# Patient Record
Sex: Male | Born: 1995 | Race: White | Hispanic: No | Marital: Married | Smoking: Current some day smoker
Health system: Southern US, Community
[De-identification: ages and names within clinical notes are randomized; demographics above are authoritative.]

---
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
## 2013-11-06 IMAGING — CT CT ABD-PELV W/ CM
1 of 2 series · 15 of 32 positions shown, 19 images · IV contrast (isovue)
Comparison: none

REASON FOR EXAM: Abd pain   inguinal  lymphatenopathy
COMMENTS:

PROCEDURE:     KCT - KCT ABDOMEN/PELVIS W  - March 01, 2011  [DATE]
RESULT:     Comparison:  None
TECHNIQUE: Multiple axial images of the abdomen and pelvis were performed
from the lung bases to the pubic symphysis, with p.o. contrast and with 85
mL of Isovue 300 intravenous contrast.

[Series 2: abd with 5.0 i40f 3 · axial · 0.68mm/px · z∈[-308,+108]mm · 15 of 91 slices shown, 19 images]
[im 4/91  soft-tissue]
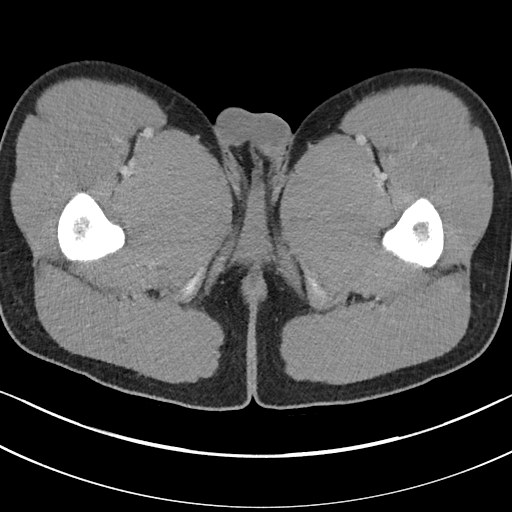
[im 4/91  bone]
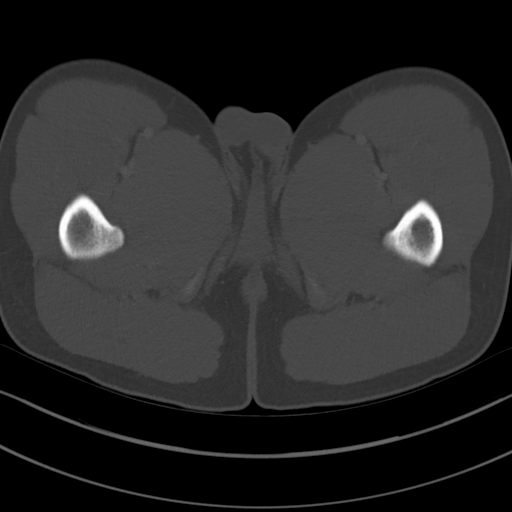
[im 11/91  soft-tissue]
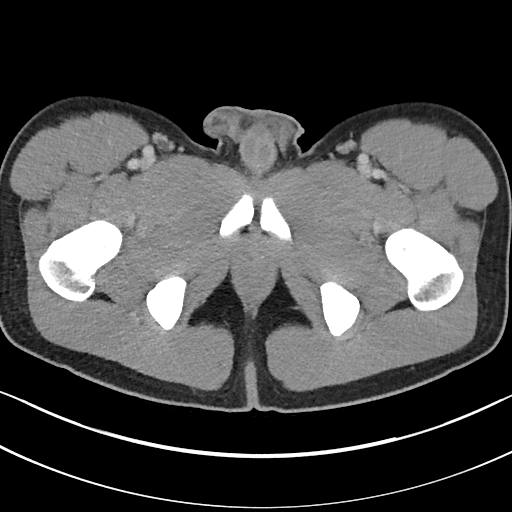
[im 19/91  soft-tissue]
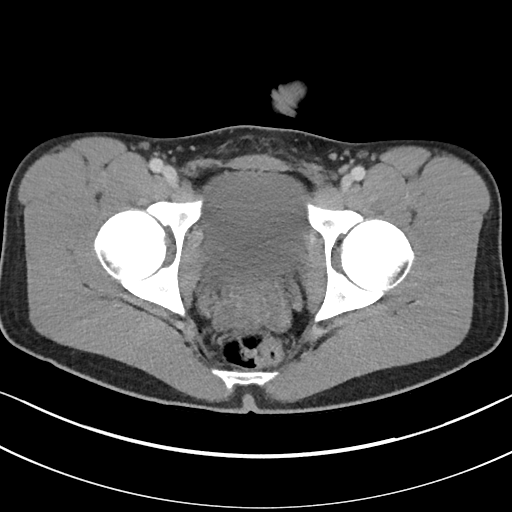
[im 26/91  soft-tissue]
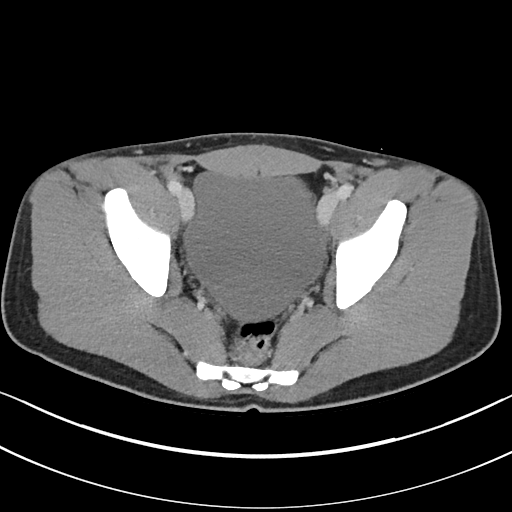
[im 33/91  soft-tissue]
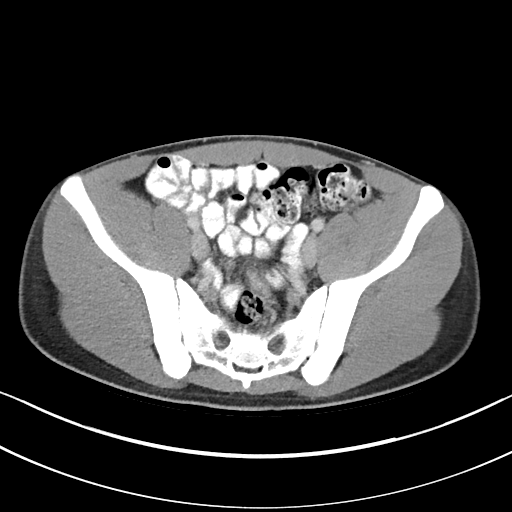
[im 40/91  soft-tissue]
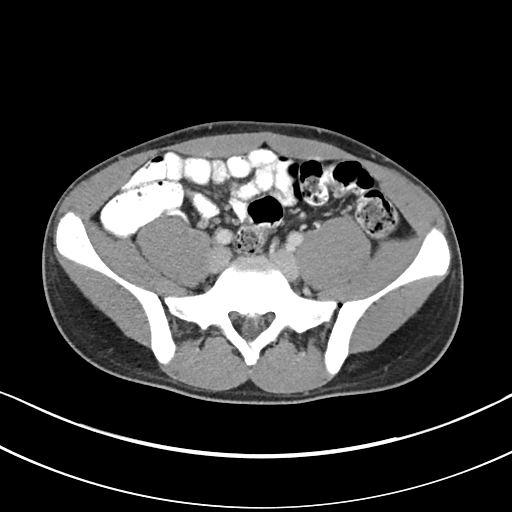
[im 47/91  soft-tissue]
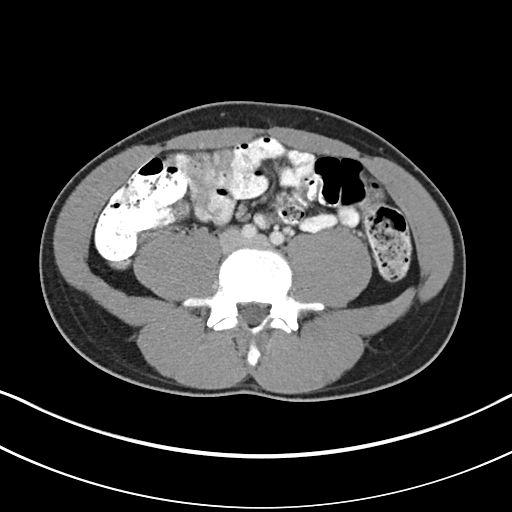
[im 51/91  soft-tissue]
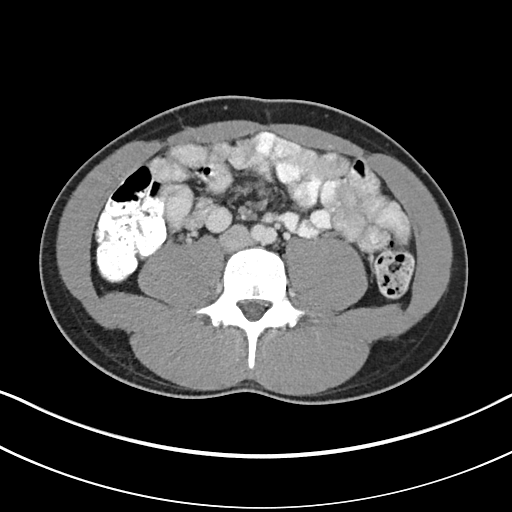
[im 58/91  soft-tissue]
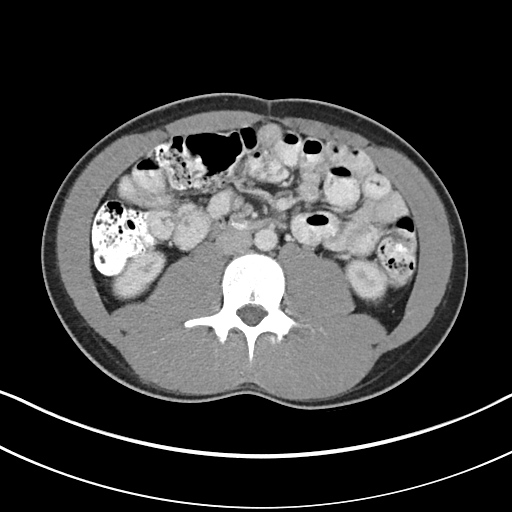
[im 58/91  bone]
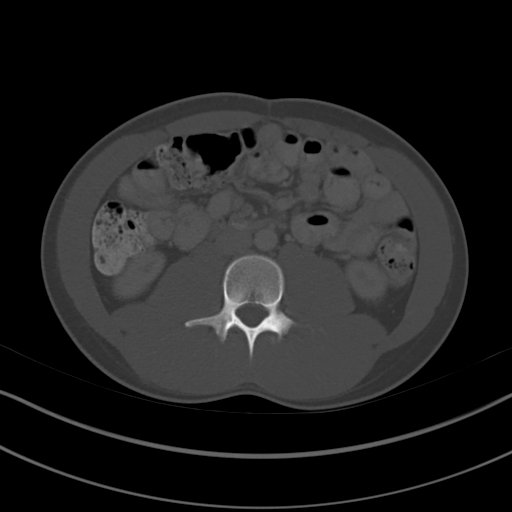
[im 65/91  soft-tissue]
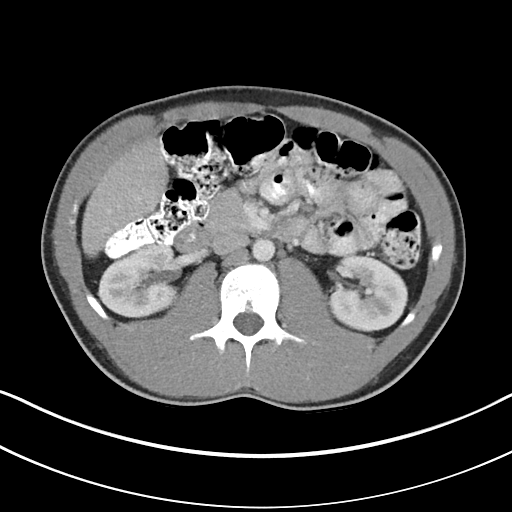
[im 73/91  soft-tissue]
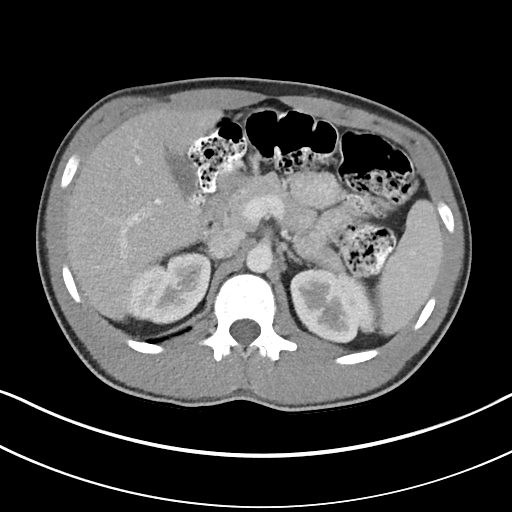
[im 76/91  lung]
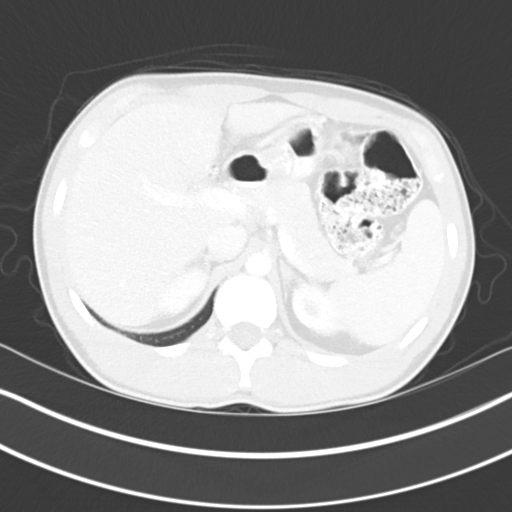
[im 80/91  soft-tissue]
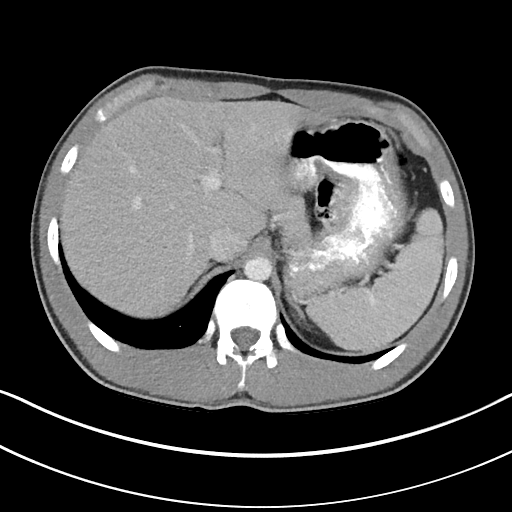
[im 80/91  lung]
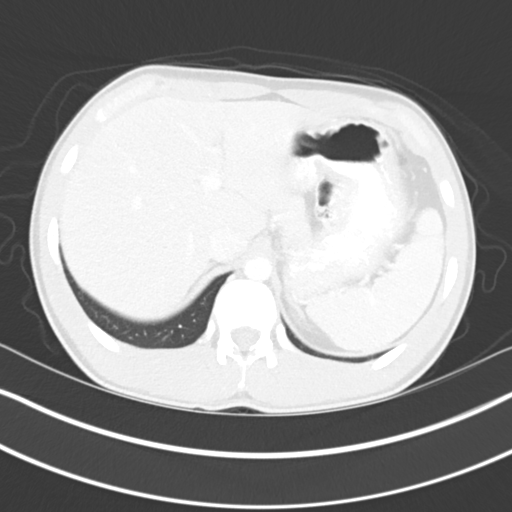
[im 83/91  lung]
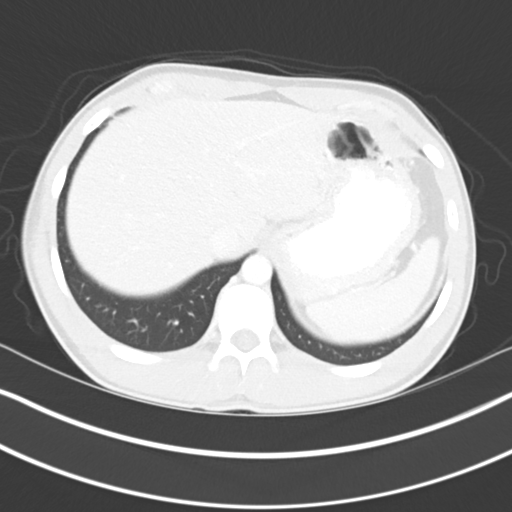
[im 87/91  soft-tissue]
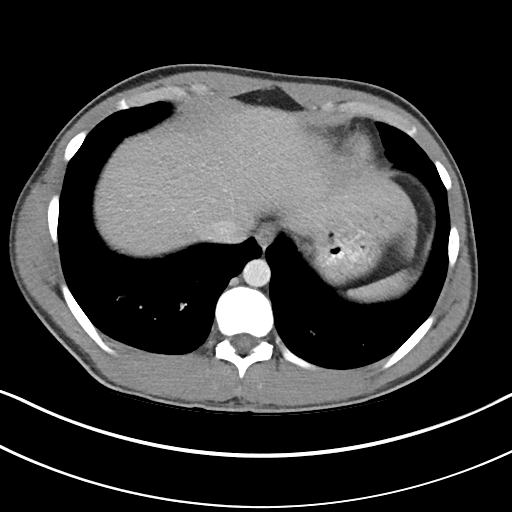
[im 87/91  lung]
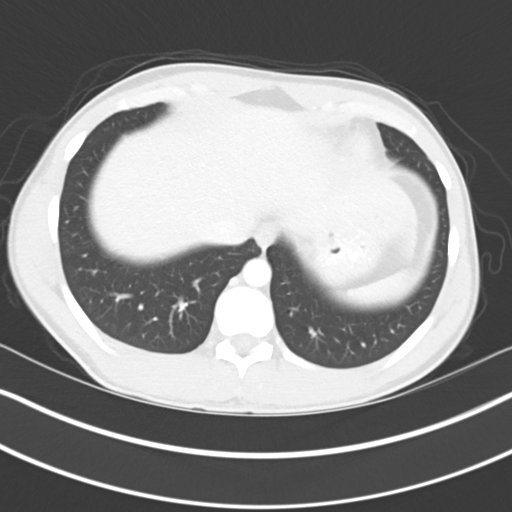

[15 of 32 positions shown; findings below may reference images not displayed]

FINDINGS: Minimal low-attenuation along falciform ligament likely represents focal
fatty deposition. The gallbladder, spleen, adrenals, pancreas, and
gallbladder are unremarkable. The kidneys enhance normally. No
retroperitoneal or mesenteric lymphadenopathy. The small and large bowel are
normal in caliber. The appendix is normal. There are small lymph nodes at
the site of the palpable markers just above the groin, bilaterally. These
are not pathologically enlarged.

No aggressive lytic or sclerotic osseous lesions are identified.
IMPRESSION: No lymphadenopathy in the abdomen or pelvis.

## 2015-03-08 ENCOUNTER — Emergency Department
Admission: EM | Admit: 2015-03-08 | Discharge: 2015-03-08 | Disposition: A | Discharge status: Home / Self Care | Payer: No Typology Code available for payment source | Attending: Emergency Medicine | Admitting: Emergency Medicine

## 2015-03-08 ENCOUNTER — Encounter: Payer: Self-pay | Admitting: Medical Oncology

## 2015-03-08 ENCOUNTER — Emergency Department: Payer: No Typology Code available for payment source

## 2015-03-08 DIAGNOSIS — S6292XA Unspecified fracture of left wrist and hand, initial encounter for closed fracture: Secondary | ICD-10-CM | POA: Diagnosis not present

## 2015-03-08 DIAGNOSIS — S62152A Displaced fracture of hook process of hamate [unciform] bone, left wrist, initial encounter for closed fracture: Secondary | ICD-10-CM | POA: Diagnosis not present

## 2015-03-08 DIAGNOSIS — S62142A Displaced fracture of body of hamate [unciform] bone, left wrist, initial encounter for closed fracture: Secondary | ICD-10-CM

## 2015-03-08 DIAGNOSIS — Y9289 Other specified places as the place of occurrence of the external cause: Secondary | ICD-10-CM | POA: Diagnosis not present

## 2015-03-08 DIAGNOSIS — Y998 Other external cause status: Secondary | ICD-10-CM | POA: Diagnosis not present

## 2015-03-08 DIAGNOSIS — Y9389 Activity, other specified: Secondary | ICD-10-CM | POA: Insufficient documentation

## 2015-03-08 DIAGNOSIS — S6992XA Unspecified injury of left wrist, hand and finger(s), initial encounter: Secondary | ICD-10-CM | POA: Diagnosis present

## 2015-03-08 MED ORDER — HYDROCODONE-ACETAMINOPHEN 5-325 MG PO TABS: 1.0000 | ORAL_TABLET | ORAL | Status: DC | PRN

## 2015-03-08 MED ORDER — HYDROCODONE-ACETAMINOPHEN 5-325 MG PO TABS
1.0000 | ORAL_TABLET | Freq: Once | ORAL | Status: AC
  Administered 2015-03-08: 1 via ORAL
  Filled 2015-03-08: qty 1

## 2015-03-08 NOTE — Discharge Instructions (Signed)
Call Dr. Samuel GermanyKrasinski's office today for an appointment. The splint on until seen by the orthopedist. Ice and elevate for swelling reduction. Norco if needed for pain.

## 2015-03-08 NOTE — ED Provider Notes (Signed)
[facility] Emergency Department Provider Note  ____________________________________________  Time seen: Approximately 7:54 AM  I have reviewed the triage vital signs and the nursing notes.   HISTORY  Chief Complaint Wrist Pain  HPI Scott Kelley is a 19 y.o. male is here complaining of left wrist pain since last night when he punched a friend while roughhousing. Patient states that during the night he woke up with severe pain to his left wrist. He denies any previous injury. He denies any head injury or loss of consciousness in this event. He is not taking any over-the-counter medication for his pain.Currently he rates his pain as 8 out of 10.   History reviewed. No pertinent past medical history.  There are no active problems to display for this patient.   History reviewed. No pertinent past surgical history.  Current Outpatient Rx  Name  Route  Sig  Dispense  Refill  . HYDROcodone-acetaminophen (NORCO/VICODIN) 5-325 MG tablet   Oral   Take 1 tablet by mouth every 4 (four) hours as needed for moderate pain.   20 tablet   0     Allergies Review of patient's allergies indicates no known allergies.  No family history on file.  Social History Social History  Substance Use Topics  . Smoking status: None  . Smokeless tobacco: None  . Alcohol Use: None    Review of Systems Constitutional: No fever/chills Eyes: No visual changes. ENT: No trauma Cardiovascular: Denies chest pain. Respiratory: Denies shortness of breath. Gastrointestinal: No abdominal pain.  No nausea, no vomiting.   Musculoskeletal: Negative for back pain. Positive left wrist pain. Skin: Negative for rash. Neurological: Negative for headaches, focal weakness or numbness.  10-point ROS otherwise negative.  ____________________________________________   PHYSICAL EXAM:  VITAL SIGNS: ED Triage Vitals  Enc Vitals Group     BP 03/08/15 0732 136/75 mmHg     Pulse Rate  03/08/15 0732 67     Resp 03/08/15 0732 16     Temp 03/08/15 0732 97.9 F (36.6 C)     Temp Source 03/08/15 0732 Oral     SpO2 03/08/15 0732 99 %     Weight 03/08/15 0728 150 lb (68.04 kg)     Height 03/08/15 0728 5\' 8"  (1.727 m)     Head Cir --      Peak Flow --      Pain Score 03/08/15 0729 8     Pain Loc --      Pain Edu? --      Excl. in GC? --     Constitutional: Alert and oriented. Well appearing and in no acute distress. Eyes: Conjunctivae are normal. PERRL. EOMI. Head: Atraumatic. Nose: No congestion/rhinnorhea. Neck: No stridor.   Cardiovascular: Normal rate, regular rhythm. Grossly normal heart sounds.  Good peripheral circulation. Respiratory: Normal respiratory effort.  No retractions. Lungs CTAB. Gastrointestinal: Soft and nontender. No distention.  Musculoskeletal: Moderate tenderness on palpation of the carpal bones left wrist. There is no gross deformity noted but there is some soft tissue edema present. Range of motion is restricted secondary to patient's pain. Range of motion distal to the injury is within normal limits and motor sensory function intact. Neurologic:  Normal speech and language. No gross focal neurologic deficits are appreciated. No gait instability. Skin:  Skin is warm, dry and intact. No rash noted. No abrasions or ecchymosis noted. Psychiatric: Mood and affect are normal. Speech and behavior are normal.  ____________________________________________   LABS (all labs ordered  are listed, but only abnormal results are displayed)  Labs Reviewed - No data to display  RADIOLOGY  Left hand x-ray per radiologist shows small acute fracture of the lumbar aspect of the hamate. I, Tommi Rumps, personally viewed and evaluated these images (plain radiographs) as part of my medical decision making.  ____________________________________________   PROCEDURES  Procedure(s) performed: None  Critical Care performed:  No  ____________________________________________   INITIAL IMPRESSION / ASSESSMENT AND PLAN / ED COURSE  Pertinent labs & imaging results that were available during my care of the patient were reviewed by me and considered in my medical decision making (see chart for details)  Patient is placed in a short arm splint and given instructions to follow-up with Dr. Martha Clan. Patient was encouraged to ice and elevate as needed for swelling. He is given a prescription for Norco as needed for pain. He is return to the emergency room if any severe worsening of his symptoms.   ____________________________________________   FINAL CLINICAL IMPRESSION(S) / ED DIAGNOSES  Final diagnoses:  Fracture, hamate, left, closed, initial encounter      Tommi Rumps, PA-C 03/08/15 1551  Myrna Blazer, MD 03/08/15 [phone]

## 2015-03-08 NOTE — ED Notes (Signed)
Pt c/o left wrist pain since punching something last night.

## 2015-07-27 ENCOUNTER — Ambulatory Visit (INDEPENDENT_AMBULATORY_CARE_PROVIDER_SITE_OTHER): Payer: BLUE CROSS/BLUE SHIELD | Admitting: Family Medicine

## 2015-07-27 ENCOUNTER — Encounter: Payer: Self-pay | Admitting: Family Medicine

## 2015-07-27 VITALS — BP 118/70 | HR 66 | Temp 97.9°F | Resp 16 | Ht 68.0 in | Wt 160.0 lb

## 2015-07-27 DIAGNOSIS — B349 Viral infection, unspecified: Secondary | ICD-10-CM | POA: Diagnosis not present

## 2015-07-27 DIAGNOSIS — J029 Acute pharyngitis, unspecified: Secondary | ICD-10-CM | POA: Diagnosis not present

## 2015-07-27 LAB — POC INFLUENZA A&B (BINAX/QUICKVUE)
Influenza A, POC: NEGATIVE
Influenza B, POC: NEGATIVE

## 2015-07-27 LAB — POCT RAPID STREP A (OFFICE): Rapid Strep A Screen: NEGATIVE

## 2015-07-27 MED ORDER — DOXYCYCLINE HYCLATE 100 MG PO TABS: 100.0000 mg | ORAL_TABLET | Freq: Two times a day (BID) | ORAL | Status: DC

## 2015-07-27 NOTE — Patient Instructions (Signed)
Discussed use of Delsym for cough, Claritin D for congestion, and Benadryl at night for post nasal drainage. Start doxycycline if fever, body aches and headache are not improving over the next day or two.

## 2015-07-27 NOTE — Progress Notes (Signed)
Subjective:     Patient ID: Scott ShropshireEvan M Kelley, male   DOB: November 15, 1995, 20 y.o.   MRN: [id number]  HPI  Chief Complaint  Patient presents with  . Generalized Body Aches    Patient comes in office with concerns of cold like symptoms. Patient reports that when he woke up today and went to work he began experiencing body aches, fatigue, sore throat, sinus pain and pressure, cough and post nasal drainage. Patient states for the past several days he has been having more frequent headaches, patient did take Tylenol this morning because he felt feverish.   Reports possible strep exposure through his girl friend's mom and flu exposure at work. He does have possibility of tick exposure playing disc golf on the weekends but has not removed any ticks. Current job is in Geologist, engineeringgraphic arts.   Review of Systems     Objective:   Physical Exam  Constitutional: He appears well-developed and well-nourished. No distress.  Ears: T.M's intact without inflammation Throat: no tonsillar enlargement or exudate Neck: no cervical adenopathy Lungs: clear     Assessment:    1. Pharyngitis - POCT rapid strep A  2. Viral syndrome - POC Influenza A&B(BINAX/QUICKVUE) - doxycycline (VIBRA-TABS) 100 MG tablet; Take 1 tablet (100 mg total) by mouth 2 (two) times daily.  Dispense: 14 tablet; Refill: 1    Plan:    Discussed otc medication. May start doxycycline if headaches, body aches and fever not abating in the next day or two.

## 2015-11-06 ENCOUNTER — Ambulatory Visit (INDEPENDENT_AMBULATORY_CARE_PROVIDER_SITE_OTHER): Payer: BLUE CROSS/BLUE SHIELD | Admitting: Family Medicine

## 2015-11-06 ENCOUNTER — Encounter: Payer: Self-pay | Admitting: Family Medicine

## 2015-11-06 VITALS — BP 104/70 | HR 74 | Temp 97.6°F | Resp 16 | Wt 161.8 lb

## 2015-11-06 DIAGNOSIS — G43009 Migraine without aura, not intractable, without status migrainosus: Secondary | ICD-10-CM | POA: Diagnosis not present

## 2015-11-06 DIAGNOSIS — T23262A Burn of second degree of back of left hand, initial encounter: Secondary | ICD-10-CM

## 2015-11-06 MED ORDER — NORTRIPTYLINE HCL 10 MG PO CAPS: ORAL_CAPSULE | ORAL | 1 refills | Status: DC

## 2015-11-06 NOTE — Patient Instructions (Signed)
Cleanse burn with soap and water and apply antibiotic ointment and dressing. Get your eye exam updated to see if that is a trigger or headaches.

## 2015-11-06 NOTE — Progress Notes (Addendum)
Subjective:     Patient ID: Scott ShropshireEvan M Banwart, male   DOB: April 27, 1995, 20 y.o.   MRN: [id number]  HPI  Chief Complaint  Patient presents with  . Fatigue    Patient comes in office today with concerns of fatigue since 11/02/15. Patient [place], patient also complains of nausea associated.   . Burn    Patient would like to havce his left hand examined for a burned he got while working on 11/02/15, patient reports applying neosporin and perioxide to skin.   States he is having up to 4 pressure headaches a week associated with photosensitivity and nausea. Reports one may have occurred after he did not get enough sleep. He now tries to get 8 hours. He treats the headaches with BC's or ibuprofen along with going into a dark room. Has had to leave work on one occasion due to headache. Current caffeine consumption is two drinks a day. Last eye exam 2 years ago (glasses). Maternal hx of migraines. 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   Review of Systems     Objective:   Physical Exam  Constitutional: He appears well-developed and well-nourished. No distress.  Eyes: EOM are normal. Pupils are equal, round, and reactive to light.  Musculoskeletal:  Grip strength 5/5  Neurological: Coordination normal.  Skin:  Dorsum of his left hand with healing first and second degree burns. Cleansed with saline with topical abx and dressing applied.       Assessment:    1. Migraine without aura and without status migrainosus, not intractable - nortriptyline (PAMELOR) 10 MG capsule; One to three at bedtime  Dispense: 30 capsule; Refill: 1  2. Second degree burn of back of hand, left, initial encounter    Plan:    Get eye exam and avoid missing sleep time. Cleanse burn with soap and water and apply abx ointment with dressing.

## 2015-12-05 ENCOUNTER — Ambulatory Visit: Payer: BLUE CROSS/BLUE SHIELD | Admitting: Family Medicine

## 2016-01-15 ENCOUNTER — Ambulatory Visit: Payer: BLUE CROSS/BLUE SHIELD | Admitting: Family Medicine

## 2016-01-16 ENCOUNTER — Encounter: Payer: Self-pay | Admitting: Family Medicine

## 2016-01-16 ENCOUNTER — Ambulatory Visit (INDEPENDENT_AMBULATORY_CARE_PROVIDER_SITE_OTHER): Payer: BLUE CROSS/BLUE SHIELD | Admitting: Family Medicine

## 2016-01-16 ENCOUNTER — Ambulatory Visit
Admission: RE | Admit: 2016-01-16 | Discharge: 2016-01-16 | Disposition: A | Discharge status: Home / Self Care | Payer: BLUE CROSS/BLUE SHIELD | Source: Ambulatory Visit | Attending: Family Medicine | Admitting: Family Medicine

## 2016-01-16 VITALS — BP 122/68 | HR 56 | Temp 98.1°F | Resp 16 | Wt 161.0 lb

## 2016-01-16 DIAGNOSIS — R1084 Generalized abdominal pain: Secondary | ICD-10-CM

## 2016-01-16 LAB — POCT URINALYSIS DIPSTICK
Bilirubin, UA: NEGATIVE
Blood, UA: NEGATIVE
Glucose, UA: NEGATIVE
Ketones, UA: NEGATIVE
Leukocytes, UA: NEGATIVE
Nitrite, UA: NEGATIVE
Protein, UA: NEGATIVE
Spec Grav, UA: >=1.03
Urobilinogen, UA: 0.2
pH, UA: 6

## 2016-01-16 MED ORDER — HYOSCYAMINE SULFATE 0.125 MG PO TABS: 0.1250 mg | ORAL_TABLET | ORAL | 0 refills | Status: DC | PRN

## 2016-01-16 NOTE — Progress Notes (Signed)
Patient: Scott Kelley Male    DOB: 22-May-1995   20 y.o.   MRN: [id number] Visit Date: 01/16/2016  Today's Provider: Mila Merry, MD   Chief Complaint  Patient presents with  . Abdominal Pain   Subjective:    HPI Patient comes in today c/o generalized abdominal pain. He reports that it has been going on for several weeks. Patient reports that he has not been eating as much due to the pain. He describes pain as a cramping sensation with associated nausea. He reports that he does feel like he has to have a BM, but sometimes nothing comes out. He also denies any fever or vomiting. It affects entire abdomen, but is worse between umbilicus and epigastrium.     No Known Allergies   Current Outpatient Prescriptions:  .  fluticasone (FLONASE) 50 MCG/ACT nasal spray, Place into both nostrils daily., Disp: , Rfl:  .  loratadine (CLARITIN) 10 MG tablet, Take 10 mg by mouth daily., Disp: , Rfl:  .  nortriptyline (PAMELOR) 10 MG capsule, One to three at bedtime, Disp: 30 capsule, Rfl: 1  Review of Systems  Constitutional: Positive for appetite change. Negative for activity change, chills, diaphoresis, fatigue, fever and unexpected weight change.  Cardiovascular: Negative.   Gastrointestinal: Positive for abdominal pain, blood in stool, constipation and nausea.  Genitourinary: Positive for dysuria. Negative for decreased urine volume, difficulty urinating, flank pain, frequency and urgency.    Social History  Substance Use Topics  . Smoking status: Current Some Day Smoker  . Smokeless tobacco: Never Used  . Alcohol use Not on file   Objective:   BP 122/68 (BP Location: Left Arm, Patient Position: Sitting, Cuff Size: Normal)   Pulse (!) 56   Temp 98.1 F (36.7 C)   Resp 16   Wt 161 lb (73 kg)   BMI 24.48 kg/m   Physical Exam  General Appearance:    Alert, cooperative, no distress  Eyes:    PERRL, conjunctiva/corneas clear, EOM's intact       Lungs:     Clear to  auscultation bilaterally, respirations unlabored  Heart:    Regular rate and rhythm  Abdomen:   bowel sounds present and normal in all 4 quadrants, soft and round. Mild diffuse tenderness, especially suprapubic area. No CVA tenderness    Results for orders placed or performed in visit on 01/16/16  POCT urinalysis dipstick  Result Value Ref Range   Color, UA yellow    Clarity, UA clear    Glucose, UA negative    Bilirubin, UA negative    Ketones, UA negative    Spec Grav, UA >=1.030    Blood, UA negative    pH, UA 6.0    Protein, UA negative    Urobilinogen, UA 0.2    Nitrite, UA negative    Leukocytes, UA Negative Negative       Assessment & Plan:     1. Generalized abdominal pain  - CBC - Comprehensive metabolic panel - H Pylori, IGM, IGG, IGA AB - Amylase - DG Abd 2 Views; Future - hyoscyamine (LEVSIN, ANASPAZ) 0.125 MG tablet; Take 1 tablet (0.125 mg total) by mouth every 4 (four) hours as needed for cramping.  Dispense: 30 tablet; Refill: 0 - POCT urinalysis dipstick     The entirety of the information documented in the History of Present Illness, Review of Systems and Physical Exam were personally obtained by me. Portions of this information were initially  documented by Anson Oregonachelle Presley, CMA and reviewed by me for thoroughness and accuracy.   Mila Merryonald Vinita Prentiss, MD  [facility]

## 2016-01-16 NOTE — Patient Instructions (Addendum)
Go to the [facility] on [address] for abdominal Xray  

## 2016-01-17 ENCOUNTER — Telehealth: Payer: Self-pay

## 2016-01-17 NOTE — Telephone Encounter (Signed)
Haven't seen a prior auth for this. Please call pharmacy and see what problem is. Maybe it be covered if they change hyoscyamine to sublingual tablets.

## 2016-01-17 NOTE — Telephone Encounter (Signed)
-----   Message from Malva Limesonald E Fisher, MD sent at 01/17/2016  1:38 PM EDT ----- Herby AbrahamXray shows some signs of constipation, so far labs are negative. Test for h. Pylori are still pending. Should start taking a tablespoon of metamucil daily and can take prescription for hyoscyamine as needed (was sent to pharmacy yesterday). H. Pylori results should be in before the weekend.

## 2016-01-17 NOTE — Telephone Encounter (Signed)
Left message to call back  

## 2016-01-17 NOTE — Telephone Encounter (Signed)
Advised patient as below. Patient reports that he wasn't able to get the prescription yesterday because it requires a prior auth. Patient wanted to know if you could change his medication to something different? Please advise. Thanks!

## 2016-01-18 LAB — COMPREHENSIVE METABOLIC PANEL
ALT: 16 IU/L (ref 0–44)
AST: 15 IU/L (ref 0–40)
Albumin/Globulin Ratio: 1.9 (ref 1.2–2.2)
Albumin: 4.8 g/dL (ref 3.5–5.5)
Alkaline Phosphatase: 64 IU/L (ref 39–117)
BUN/Creatinine Ratio: 12 (ref 9–20)
BUN: 14 mg/dL (ref 6–20)
Bilirubin Total: 1.3 mg/dL — ABNORMAL HIGH (ref 0.0–1.2)
CO2: 27 mmol/L (ref 18–29)
Calcium: 10 mg/dL (ref 8.7–10.2)
Chloride: 101 mmol/L (ref 96–106)
Creatinine, Ser: 1.16 mg/dL (ref 0.76–1.27)
GFR calc Af Amer: 103 mL/min/{1.73_m2} (ref >59)
GFR calc non Af Amer: 89 mL/min/{1.73_m2} (ref >59)
Globulin, Total: 2.5 g/dL (ref 1.5–4.5)
Glucose: 88 mg/dL (ref 65–99)
Potassium: 4.1 mmol/L (ref 3.5–5.2)
Sodium: 142 mmol/L (ref 134–144)
Total Protein: 7.3 g/dL (ref 6.0–8.5)

## 2016-01-18 LAB — H PYLORI, IGM, IGG, IGA AB
H Pylori IgG: <0.9 U/mL (ref 0.0–0.8)
H pylori, IgM Abs: <9 units (ref 0.0–8.9)
H. pylori, IgA Abs: <9 units (ref 0.0–8.9)

## 2016-01-18 LAB — CBC
Hematocrit: 45.6 % (ref 37.5–51.0)
Hemoglobin: 15.9 g/dL (ref 12.6–17.7)
MCH: 31.9 pg (ref 26.6–33.0)
MCHC: 34.9 g/dL (ref 31.5–35.7)
MCV: 91 fL (ref 79–97)
Platelets: 219 10*3/uL (ref 150–379)
RBC: 4.99 x10E6/uL (ref 4.14–5.80)
RDW: 13 % (ref 12.3–15.4)
WBC: 7.1 10*3/uL (ref 3.4–10.8)

## 2016-01-18 LAB — AMYLASE: Amylase: 111 U/L (ref 31–124)

## 2016-01-18 NOTE — Telephone Encounter (Signed)
Called [facility] concerning hyoscyamine rx. Pharmacy stated that there is no PA needed. Patient's out-of-pocket cost was $15 and they did not know why he didn't get the rx. Pharmacy also said that this medication is not approved by FDA.

## 2016-03-28 ENCOUNTER — Encounter: Payer: Self-pay | Admitting: Family Medicine

## 2016-03-28 ENCOUNTER — Ambulatory Visit (INDEPENDENT_AMBULATORY_CARE_PROVIDER_SITE_OTHER): Payer: BLUE CROSS/BLUE SHIELD | Admitting: Family Medicine

## 2016-03-28 VITALS — BP 110/74 | HR 68 | Temp 97.6°F | Resp 16 | Wt 164.4 lb

## 2016-03-28 DIAGNOSIS — J069 Acute upper respiratory infection, unspecified: Secondary | ICD-10-CM | POA: Diagnosis not present

## 2016-03-28 DIAGNOSIS — B9789 Other viral agents as the cause of diseases classified elsewhere: Secondary | ICD-10-CM

## 2016-03-28 NOTE — Progress Notes (Signed)
Subjective:     Patient ID: Kellie ShropshireEvan M Sumler, male   DOB: 03-25-1996, 20 y.o.   MRN: [id number]  HPI  Chief Complaint  Patient presents with  . Sinus Problem    Patient comes into office with concerns of sinus pain and pressure that began last night 12/27. Patient reports symptoms of cough, sore throat ad post nasal drip, he denies any otc medication.   States co-workers have been sick as well.   Review of Systems     Objective:   Physical Exam  Constitutional: He appears well-developed and well-nourished. No distress.  Ears: T.M's intact without inflammation Throat: no tonsillar enlargement or exudate Neck: no cervical adenopathy Lungs: clear     Assessment:    1. Viral upper respiratory tract infection    Plan:    Discussed use of Mucinex D for congestion, Delsym for cough, and Benadryl for postnasal drainage   Work excuse for 12/28-12/31

## 2016-03-28 NOTE — Patient Instructions (Signed)
Discussed use of Mucinex D for congestion, Delsym for cough, and Benadryl for postnasal drainage 

## 2016-05-14 ENCOUNTER — Encounter: Payer: Self-pay | Admitting: Family Medicine

## 2016-05-14 ENCOUNTER — Ambulatory Visit (INDEPENDENT_AMBULATORY_CARE_PROVIDER_SITE_OTHER): Payer: BLUE CROSS/BLUE SHIELD | Admitting: Family Medicine

## 2016-05-14 VITALS — BP 110/72 | HR 82 | Temp 97.8°F | Resp 16 | Wt 162.0 lb

## 2016-05-14 DIAGNOSIS — K529 Noninfective gastroenteritis and colitis, unspecified: Secondary | ICD-10-CM | POA: Diagnosis not present

## 2016-05-14 NOTE — Patient Instructions (Signed)

## 2016-05-14 NOTE — Progress Notes (Signed)
       Patient: Scott Kelley Male    DOB: [birth year]   20 y.o.   MRN: [id number] Visit Date: 05/14/2016  Today's Provider: Mila Merryonald Fisher, MD   Chief Complaint  Patient presents with  . Emesis    this morning   Subjective:    Emesis   This is a new problem. The current episode started today (this morning). The problem has been rapidly improving. The emesis has an appearance of stomach contents. There has been no fever. Associated symptoms include arthralgias, chills, diarrhea, headaches and myalgias. Pertinent negatives include no abdominal pain, chest pain, coughing, dizziness, fever, sweats, URI or weight loss. He has tried nothing for the symptoms.       No Known Allergies  No current outpatient prescriptions on file.  Review of Systems  Constitutional: Positive for chills and fatigue. Negative for diaphoresis, fever and weight loss.  Eyes: Positive for discharge (watery eyes).  Respiratory: Negative for cough and shortness of breath.   Cardiovascular: Negative for chest pain.  Gastrointestinal: Positive for diarrhea, nausea and vomiting. Negative for abdominal pain.  Musculoskeletal: Positive for arthralgias and myalgias.  Neurological: Positive for headaches. Negative for dizziness.    Social History  Substance Use Topics  . Smoking status: Current Some Day Smoker  . Smokeless tobacco: Never Used  . Alcohol use Yes     Comment: rare   Objective:   BP 110/72 (BP Location: Left Arm, Patient Position: Sitting, Cuff Size: Normal)   Pulse 82   Temp 97.8 F (36.6 C) (Oral)   Resp 16   Wt 162 lb (73.5 kg)   SpO2 99% Comment: room air  BMI 24.63 kg/m   Physical Exam  General Appearance:    Alert, cooperative, no distress, moist mucous membranes.  Eyes:    PERRL, conjunctiva/corneas clear, EOM's intact       Lungs:     Clear to auscultation bilaterally, respirations unlabored  Heart:    Regular rate and rhythm  Abdomen:   bowel sounds present and hypoactive in  all 4 quadrants, soft, round, Slight generalalized tenderness nondistended. No CVA tenderness        Assessment & Plan:     1. Gastroenteritis Push fluids. Dietary and sanitation counseling. Call if not resolved in 24 to 48 hours.        Mila Merryonald Fisher, MD  [facility]

## 2017-11-13 IMAGING — CR DG HAND COMPLETE 3+V*L*
1 series · 3 of 3 positions shown · non-contrast
Comparison: None.

CLINICAL DATA: 19-year-old male with left hand pain after blunt
trauma yesterday. Initial encounter.

EXAM:
LEFT HAND - COMPLETE 3+ VIEW

[Series 1: pa · 0.17mm/px · 3 of 3 slices shown]
[im 1/3]
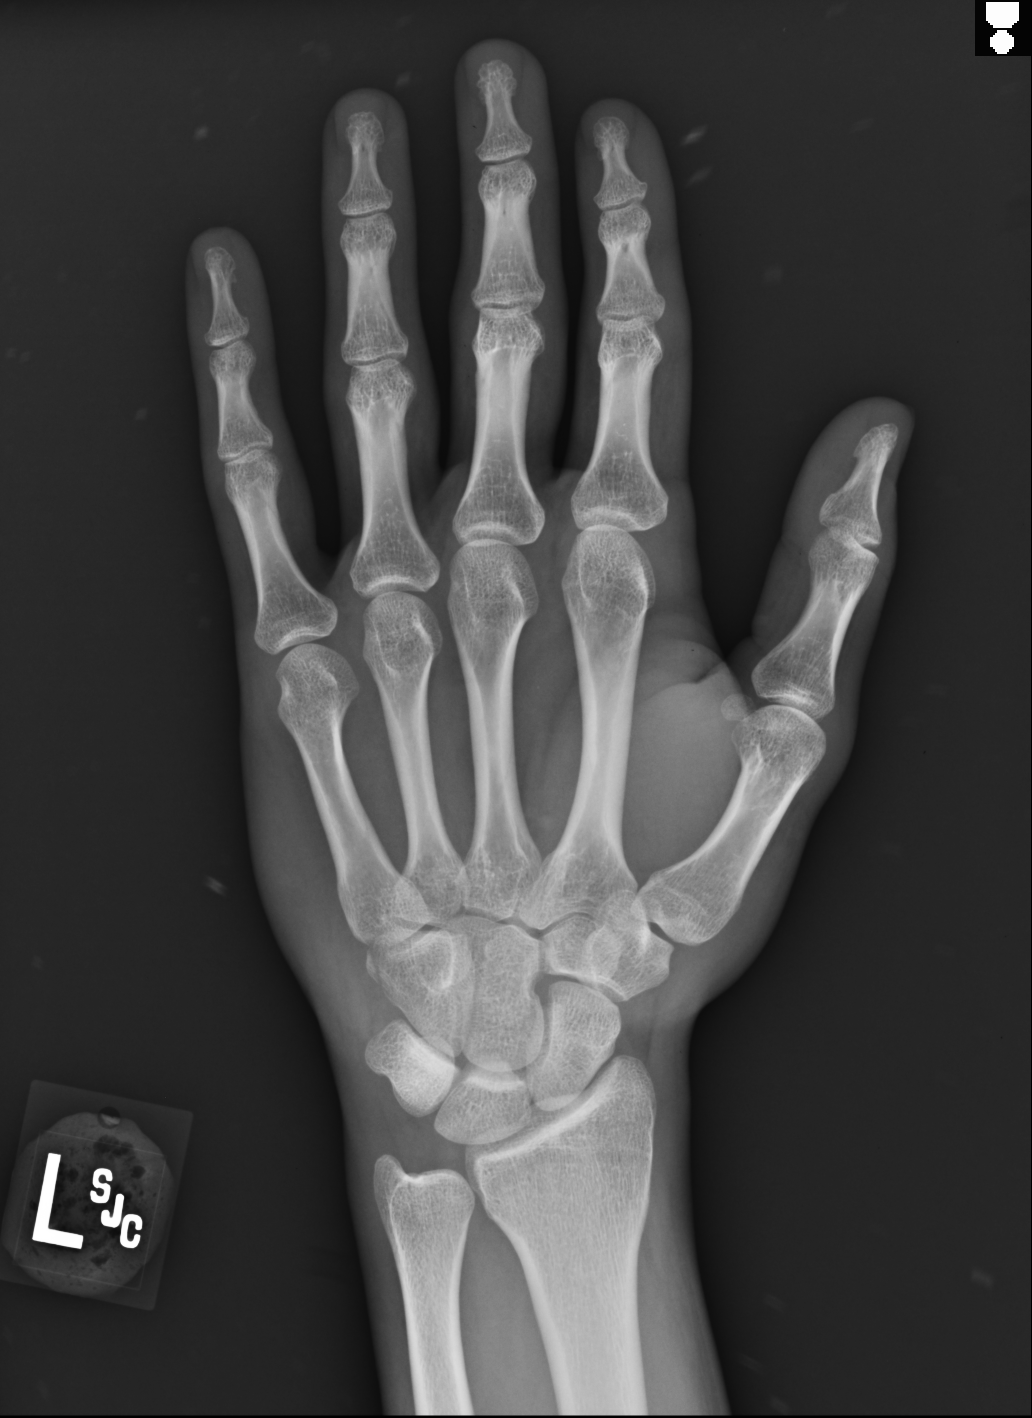
[im 2/3]
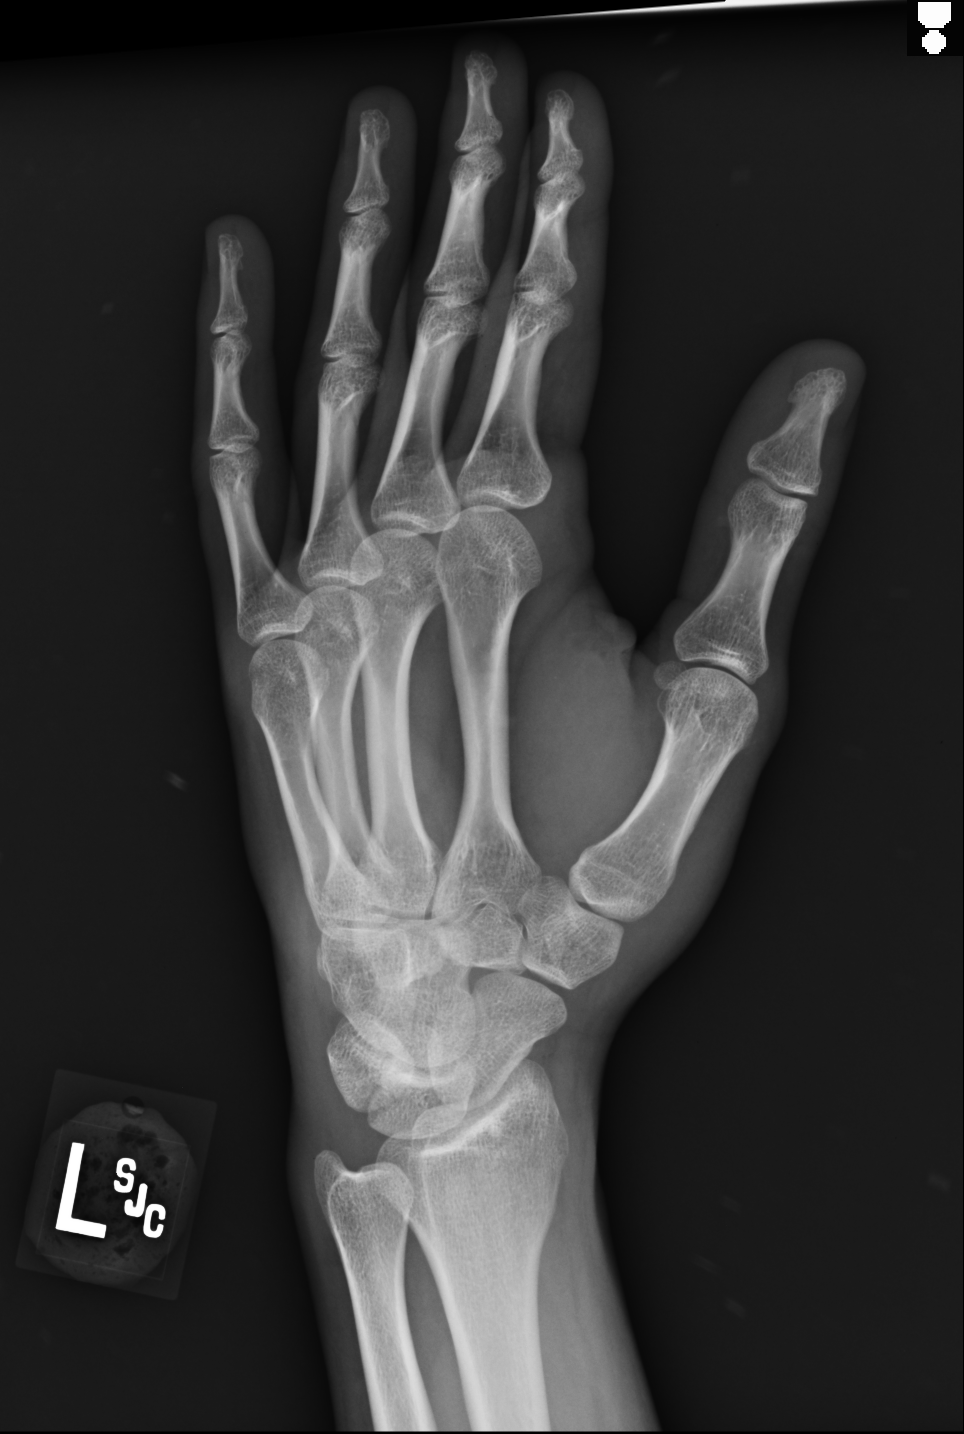
[im 3/3]
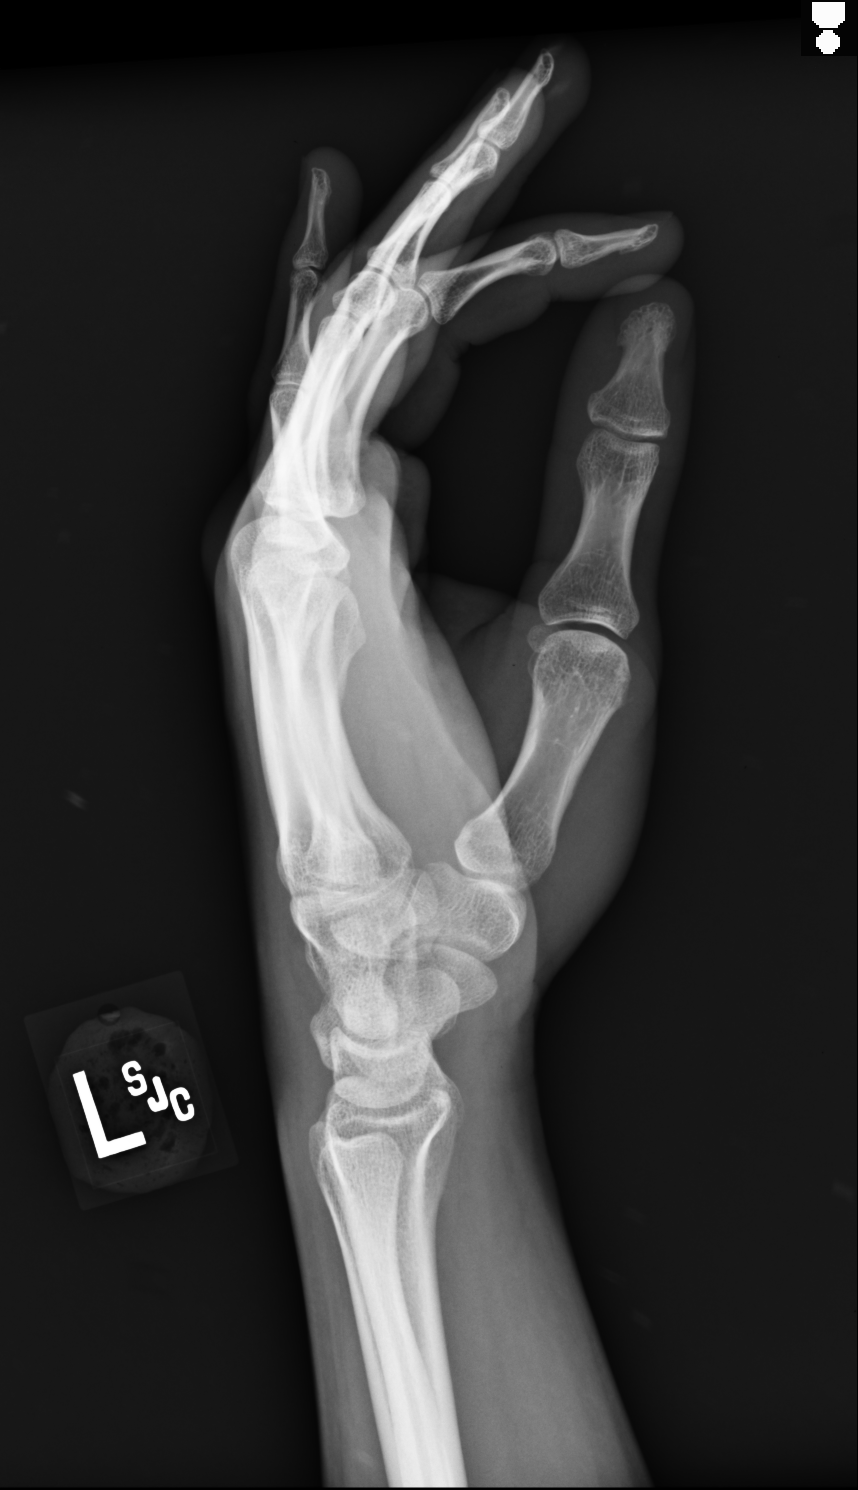

[3 of 3 positions shown; findings below may reference images not displayed]

FINDINGS: Bone mineralization is within normal limits. Small fracture fragment
at the ulnar aspect of the hamate (arrow) with overlying soft tissue
swelling/ hematoma. Other carpal bones appear within normal limits.
Distal radius and ulna intact. Metacarpals and phalanges intact.
Joint spaces and alignment within normal limits.
IMPRESSION: Small acute fracture of the ulnar aspect of the hamate with
overlying soft tissue swelling.

## 2019-02-09 ENCOUNTER — Ambulatory Visit (INDEPENDENT_AMBULATORY_CARE_PROVIDER_SITE_OTHER): Payer: No Typology Code available for payment source | Admitting: Family Medicine

## 2019-02-09 ENCOUNTER — Encounter: Payer: Self-pay | Admitting: Family Medicine

## 2019-02-09 ENCOUNTER — Other Ambulatory Visit: Payer: Self-pay

## 2019-02-09 DIAGNOSIS — K529 Noninfective gastroenteritis and colitis, unspecified: Secondary | ICD-10-CM

## 2019-02-09 MED ORDER — DICYCLOMINE HCL 10 MG PO CAPS: 10.0000 mg | ORAL_CAPSULE | Freq: Three times a day (TID) | ORAL | 0 refills | Status: DC

## 2019-02-09 NOTE — Progress Notes (Signed)
Scott Kelley  MRN: [id number] DOB: [birth year]  Subjective:  HPI   The patient is a 23 year old male who presents via electronic device for diarrhea. He states he started having diarrhea yesterday and it continues today.   He has abdominal cramping and vomited today.  He checked his temperature and states he had no fever but he doesn't trust his thermometer.  He said that he was very clammy this morning.  He had headache yesterday and this morning but it is a little better. He has some sinus pain yesterday and took some sinus medication.  He does not feel like he has any head congestion or fullness.  Virtual Visit via Video Note  I connected with Scott Kelley on 02/09/19 at 11:00 AM EST by a video enabled telemedicine application and verified that I am speaking with the correct person using two identifiers.  Location: Patient: home Provider: office   I discussed the limitations of evaluation and management by telemedicine and the availability of in person appointments. The patient expressed understanding and agreed to proceed.   Patient Active Problem List   Diagnosis Date Noted  . Migraine without aura and without status migrainosus, not intractable 11/06/2015   No past medical history on file.  No past surgical history on file.  No family history on file.  Social History   Socioeconomic History  . Marital status: Single    Spouse name: Not on file  . Number of children: Not on file  . Years of education: Not on file  . Highest education level: Not on file  Occupational History  . Not on file  Social Needs  . Financial resource strain: Not on file  . Food insecurity    Worry: Not on file    Inability: Not on file  . Transportation needs    Medical: Not on file    Non-medical: Not on file  Tobacco Use  . Smoking status: Current Some Day Smoker  . Smokeless tobacco: Never Used  Substance and Sexual Activity  . Alcohol use: Yes    Comment: rare  . Drug use: Not  on file  . Sexual activity: Not on file  Lifestyle  . Physical activity    Days per week: Not on file    Minutes per session: Not on file  . Stress: Not on file  Relationships  . Social Musician on phone: Not on file    Gets together: Not on file    Attends religious service: Not on file    Active member of club or organization: Not on file    Attends meetings of clubs or organizations: Not on file    Relationship status: Not on file  . Intimate partner violence    Fear of current or ex partner: Not on file    Emotionally abused: Not on file    Physically abused: Not on file    Forced sexual activity: Not on file  Other Topics Concern  . Not on file  Social History Narrative  . Not on file    No outpatient encounter medications on file as of 02/09/2019.   No facility-administered encounter medications on file as of 02/09/2019.    No Known Allergies  Review of Systems  Constitutional: Positive for fever (possibly-felt clamy) and malaise/fatigue. Negative for chills and diaphoresis.  HENT: Positive for sinus pain. Negative for congestion, ear pain and sore throat.   Respiratory: Negative for cough and shortness of  breath.   Cardiovascular: Negative for chest pain.  Gastrointestinal: Positive for abdominal pain, diarrhea, nausea and vomiting. Negative for heartburn (not today or yesterday but more than normal recently).  Musculoskeletal: Positive for myalgias.  Neurological: Positive for headaches.    Objective:  There were no vitals taken for this visit.  WDWN male in no apparent distress.  Head: Normocephalic, atraumatic. Neck: Supple, NROM Respiratory: No apparent distress Psych: Normal mood and affect   Assessment and Plan :   1. Gastroenteritis Developed diarrhea with abdominal cramps yesterday. Vomited twice this morning with continued liquid stools. Has had some headache around eyes but temperature around 97 and no cough, shortness of breath sore  throat. Suspect viral gastroenteritis and will treat with Bentyl for cramps. May use Imodium-AD if diarrhea persists, Increase fluid intake and try a bland diet later today. Out of work 02-08-19 through 02-11-19. He will have someone come by the office to pick up a note for work. If fever over 100, cough, dyspnea, severe fatigue, loss of taste or smell develops, may need to consider COVID test. Continue pandemic restrictions. - dicyclomine (BENTYL) 10 MG capsule; Take 1 capsule (10 mg total) by mouth 4 (four) times daily -  before meals and at bedtime.  Dispense: 20 capsule; Refill: 0   I discussed the assessment and treatment plan with the patient. The patient was provided an opportunity to ask questions and all were answered. The patient agreed with the plan and demonstrated an understanding of the instructions.   The patient was advised to call back or seek an in-person evaluation if the symptoms worsen or if the condition fails to improve as anticipated.  I provided 15 minutes of non-face-to-face time during this encounter.

## 2020-01-13 ENCOUNTER — Other Ambulatory Visit: Payer: 59

## 2020-01-13 DIAGNOSIS — Z20822 Contact with and (suspected) exposure to covid-19: Secondary | ICD-10-CM

## 2020-01-14 LAB — NOVEL CORONAVIRUS, NAA: SARS-CoV-2, NAA: NOT DETECTED

## 2020-01-14 LAB — SARS-COV-2, NAA 2 DAY TAT

## 2022-07-10 ENCOUNTER — Ambulatory Visit
Admission: RE | Admit: 2022-07-10 | Discharge: 2022-07-10 | Disposition: A | Discharge status: Home / Self Care | Payer: BC Managed Care – PPO | Source: Ambulatory Visit | Attending: Nurse Practitioner | Admitting: Nurse Practitioner

## 2022-07-10 VITALS — BP 113/77 | HR 100 | Temp 98.7°F | Resp 18

## 2022-07-10 DIAGNOSIS — R112 Nausea with vomiting, unspecified: Secondary | ICD-10-CM | POA: Diagnosis not present

## 2022-07-10 DIAGNOSIS — Z1152 Encounter for screening for COVID-19: Secondary | ICD-10-CM | POA: Insufficient documentation

## 2022-07-10 DIAGNOSIS — R509 Fever, unspecified: Secondary | ICD-10-CM | POA: Diagnosis not present

## 2022-07-10 LAB — POCT RAPID STREP A (OFFICE): Rapid Strep A Screen: NEGATIVE

## 2022-07-10 LAB — POCT INFLUENZA A/B
Influenza A, POC: NEGATIVE
Influenza B, POC: NEGATIVE

## 2022-07-10 MED ORDER — ONDANSETRON HCL 4 MG PO TABS: 4.0000 mg | ORAL_TABLET | Freq: Four times a day (QID) | ORAL | 0 refills | Status: AC

## 2022-07-10 NOTE — ED Provider Notes (Signed)
[facility]    CSN: [id number] Arrival date & time: 07/10/22  1210      History   Chief Complaint Chief Complaint  Patient presents with   Cough    HPI Scott Kelley is a 27 y.o. male.   HPI  He is in today because he has been sick on his stomach.  He reports that he has a cough, scratchy throat, nasal congestion, body aches.  He reports that he was a festival over the weekend.  He did have a friend who is experiencing similar symptoms.  He has not tried any over-the-counter treatment.  He denies any shortness of breath, chest pain, diarrhea or constipation.   History reviewed. No pertinent past medical history.  Patient Active Problem List   Diagnosis Date Noted   Migraine without aura and without status migrainosus, not intractable 11/06/2015    History reviewed. No pertinent surgical history.     Home Medications    Prior to Admission medications   Medication Sig Start Date End Date Taking? Authorizing Provider  ondansetron (ZOFRAN) 4 MG tablet Take 1 tablet (4 mg total) by mouth every 6 (six) hours. 07/10/22  Yes Barbette Merino, NP    Family History History reviewed. No pertinent family history.  Social History Social History   Tobacco Use   Smoking status: Some Days   Smokeless tobacco: Never  Substance Use Topics   Alcohol use: Yes    Comment: rare     Allergies   Patient has no known allergies.   Review of Systems Review of Systems   Physical Exam Triage Vital Signs ED Triage Vitals [07/10/22 1304]  Enc Vitals Group     BP 113/77     Pulse Rate 100     Resp 18     Temp 98.7 F (37.1 C)     Temp Source Oral     SpO2 97 %     Weight      Height      Head Circumference      Peak Flow      Pain Score 4     Pain Loc      Pain Edu?      Excl. in GC?    No data found.  Updated Vital Signs BP 113/77 (BP Location: Left Arm)   Pulse 100   Temp 98.7 F (37.1 C) (Oral)   Resp 18   SpO2 97%   Visual Acuity Right  Eye Distance:   Left Eye Distance:   Bilateral Distance:    Right Eye Near:   Left Eye Near:    Bilateral Near:     Physical Exam Constitutional:      General: He is not in acute distress.    Appearance: He is normal weight.  HENT:     Head: Normocephalic and atraumatic.     Nose: Nose normal.     Mouth/Throat:     Mouth: Mucous membranes are moist.     Pharynx: Posterior oropharyngeal erythema present. No oropharyngeal exudate.  Cardiovascular:     Rate and Rhythm: Normal rate and regular rhythm.  Pulmonary:     Effort: Pulmonary effort is normal.  Musculoskeletal:        General: Normal range of motion.     Cervical back: Normal range of motion.  Skin:    General: Skin is warm.     Capillary Refill: Capillary refill takes less than 2 seconds.  Neurological:  General: No focal deficit present.     Mental Status: He is alert and oriented to person, place, and time.  Psychiatric:        Mood and Affect: Mood normal.        Behavior: Behavior normal.      UC Treatments / Results  Labs (all labs ordered are listed, but only abnormal results are displayed) Labs Reviewed  SARS CORONAVIRUS 2 (TAT 6-24 HRS)  POCT RAPID STREP A (OFFICE)  POCT INFLUENZA A/B    EKG   Radiology No results found.  Procedures Procedures (including critical care time)  Medications Ordered in UC Medications - No data to display  Initial Impression / Assessment and Plan / UC Course  I have reviewed the triage vital signs and the nursing notes.  Pertinent labs & imaging results that were available during my care of the patient were reviewed by me and considered in my medical decision making (see chart for details).     Fever and chills Nausea and vomiting  Final Clinical Impressions(s) / UC Diagnoses   Final diagnoses:  Fever and chills  Nausea and vomiting, unspecified vomiting type     Discharge Instructions      Your Influenza and Strep Test are all negative. Your  COVID is pending. Your results will show in your MyChart. Any positive results will result in a phone call from a nurse with next steps and recommendations.   We encourage conservative treatment with symptom relief. We encourage you to use Tylenol for your pain (Remember to use as directed do not exceed daily dosing recommendations). We also encourage salt water gargles for your sore throat. You should also consider throat lozenges and chloraseptic spray.  You have been prescribed Zofran 4 mg every 8 hours as needed for nausea     ED Prescriptions     Medication Sig Dispense Auth. Provider   ondansetron (ZOFRAN) 4 MG tablet Take 1 tablet (4 mg total) by mouth every 6 (six) hours. 12 tablet Barbette Merino, NP      PDMP not reviewed this encounter.   Thad Ranger [place], [place] 07/10/22 1446

## 2022-07-10 NOTE — ED Triage Notes (Signed)
Pt c/o "sick on my stomach," scratchy throat, body aches, shakes, feeling hot and cold, cough, sneeze, nasal drainage  Onset ~ Sunday

## 2022-07-10 NOTE — Discharge Instructions (Addendum)
Your Influenza and Strep Test are all negative. Your COVID is pending. Your results will show in your MyChart. Any positive results will result in a phone call from a nurse with next steps and recommendations.   We encourage conservative treatment with symptom relief. We encourage you to use Tylenol for your pain (Remember to use as directed do not exceed daily dosing recommendations). We also encourage salt water gargles for your sore throat. You should also consider throat lozenges and chloraseptic spray.  You have been prescribed Zofran 4 mg every 8 hours as needed for nausea

## 2022-07-11 LAB — SARS CORONAVIRUS 2 (TAT 6-24 HRS): SARS Coronavirus 2: NEGATIVE
